# Patient Record
Sex: Male | Born: 1960 | Race: White | Hispanic: No | Marital: Married | Smoking: Never smoker
Health system: Southern US, Community
[De-identification: ages and names within clinical notes are randomized; demographics above are authoritative.]

## PROBLEM LIST (undated history)

## (undated) DIAGNOSIS — H532 Diplopia: Secondary | ICD-10-CM

## (undated) DIAGNOSIS — I10 Essential (primary) hypertension: Secondary | ICD-10-CM

## (undated) DIAGNOSIS — E785 Hyperlipidemia, unspecified: Secondary | ICD-10-CM

## (undated) DIAGNOSIS — R7989 Other specified abnormal findings of blood chemistry: Secondary | ICD-10-CM

## (undated) DIAGNOSIS — T7840XA Allergy, unspecified, initial encounter: Secondary | ICD-10-CM

## (undated) HISTORY — DX: Allergy, unspecified, initial encounter: T78.40XA

## (undated) HISTORY — PX: TONSILLECTOMY: SUR1361

## (undated) HISTORY — DX: Hyperlipidemia, unspecified: E78.5

## (undated) HISTORY — DX: Diplopia: H53.2

---

## 2013-12-20 ENCOUNTER — Emergency Department (HOSPITAL_COMMUNITY): Payer: 59

## 2013-12-20 ENCOUNTER — Encounter (HOSPITAL_COMMUNITY): Payer: Self-pay | Admitting: Emergency Medicine

## 2013-12-20 ENCOUNTER — Inpatient Hospital Stay (HOSPITAL_COMMUNITY)
Admission: EM | Admit: 2013-12-20 | Discharge: 2013-12-21 | DRG: 149 | Disposition: A | Discharge status: Home / Self Care | Payer: 59 | Attending: Internal Medicine | Admitting: Internal Medicine

## 2013-12-20 ENCOUNTER — Inpatient Hospital Stay (HOSPITAL_COMMUNITY): Payer: 59

## 2013-12-20 DIAGNOSIS — E785 Hyperlipidemia, unspecified: Secondary | ICD-10-CM | POA: Diagnosis present

## 2013-12-20 DIAGNOSIS — Z79899 Other long term (current) drug therapy: Secondary | ICD-10-CM

## 2013-12-20 DIAGNOSIS — I63539 Cerebral infarction due to unspecified occlusion or stenosis of unspecified posterior cerebral artery: Secondary | ICD-10-CM | POA: Insufficient documentation

## 2013-12-20 DIAGNOSIS — Z6826 Body mass index (BMI) 26.0-26.9, adult: Secondary | ICD-10-CM | POA: Diagnosis not present

## 2013-12-20 DIAGNOSIS — Z7982 Long term (current) use of aspirin: Secondary | ICD-10-CM | POA: Diagnosis not present

## 2013-12-20 DIAGNOSIS — Z880 Allergy status to penicillin: Secondary | ICD-10-CM

## 2013-12-20 DIAGNOSIS — I6340 Cerebral infarction due to embolism of unspecified cerebral artery: Secondary | ICD-10-CM

## 2013-12-20 DIAGNOSIS — H5500 Unspecified nystagmus: Secondary | ICD-10-CM | POA: Diagnosis present

## 2013-12-20 DIAGNOSIS — J019 Acute sinusitis, unspecified: Secondary | ICD-10-CM | POA: Diagnosis present

## 2013-12-20 DIAGNOSIS — H8309 Labyrinthitis, unspecified ear: Principal | ICD-10-CM | POA: Diagnosis present

## 2013-12-20 DIAGNOSIS — J0100 Acute maxillary sinusitis, unspecified: Secondary | ICD-10-CM

## 2013-12-20 DIAGNOSIS — R42 Dizziness and giddiness: Secondary | ICD-10-CM

## 2013-12-20 DIAGNOSIS — E669 Obesity, unspecified: Secondary | ICD-10-CM | POA: Diagnosis present

## 2013-12-20 DIAGNOSIS — I639 Cerebral infarction, unspecified: Secondary | ICD-10-CM | POA: Insufficient documentation

## 2013-12-20 DIAGNOSIS — H8110 Benign paroxysmal vertigo, unspecified ear: Secondary | ICD-10-CM | POA: Diagnosis present

## 2013-12-20 DIAGNOSIS — H8120 Vestibular neuronitis, unspecified ear: Secondary | ICD-10-CM

## 2013-12-20 DIAGNOSIS — I10 Essential (primary) hypertension: Secondary | ICD-10-CM | POA: Diagnosis present

## 2013-12-20 HISTORY — DX: Other specified abnormal findings of blood chemistry: R79.89

## 2013-12-20 HISTORY — DX: Essential (primary) hypertension: I10

## 2013-12-20 LAB — I-STAT CHEM 8, ED
BUN: 14 mg/dL (ref 6–23)
CALCIUM ION: 1.19 mmol/L (ref 1.12–1.23)
CREATININE: 0.9 mg/dL (ref 0.50–1.35)
Chloride: 101 mEq/L (ref 96–112)
Glucose, Bld: 110 mg/dL — ABNORMAL HIGH (ref 70–99)
HCT: 53 % — ABNORMAL HIGH (ref 39.0–52.0)
Hemoglobin: 18 g/dL — ABNORMAL HIGH (ref 13.0–17.0)
Potassium: 4 mEq/L (ref 3.7–5.3)
Sodium: 141 mEq/L (ref 137–147)
TCO2: 31 mmol/L (ref 0–100)

## 2013-12-20 LAB — CBC
HCT: 47.8 % (ref 39.0–52.0)
Hemoglobin: 16.2 g/dL (ref 13.0–17.0)
MCH: 31.5 pg (ref 26.0–34.0)
MCHC: 33.9 g/dL (ref 30.0–36.0)
MCV: 93 fL (ref 78.0–100.0)
PLATELETS: 288 10*3/uL (ref 150–400)
RBC: 5.14 MIL/uL (ref 4.22–5.81)
RDW: 11.8 % (ref 11.5–15.5)
WBC: 10.8 10*3/uL — ABNORMAL HIGH (ref 4.0–10.5)

## 2013-12-20 MED ORDER — STROKE: EARLY STAGES OF RECOVERY BOOK
Freq: Once | Status: AC
  Administered 2013-12-20: 18:00:00
  Filled 2013-12-20: qty 1

## 2013-12-20 MED ORDER — ASPIRIN 325 MG PO TABS
325.0000 mg | ORAL_TABLET | Freq: Every day | ORAL | Status: DC
Start: 2013-12-20 — End: 2013-12-21
  Administered 2013-12-20 – 2013-12-21 (×2): 325 mg via ORAL
  Filled 2013-12-20 (×2): qty 1

## 2013-12-20 MED ORDER — GADOBENATE DIMEGLUMINE 529 MG/ML IV SOLN
15.0000 mL | Freq: Once | INTRAVENOUS | Status: AC | PRN
  Administered 2013-12-20: 15 mL via INTRAVENOUS

## 2013-12-20 MED ORDER — ENOXAPARIN SODIUM 40 MG/0.4ML ~~LOC~~ SOLN
40.0000 mg | SUBCUTANEOUS | Status: DC
  Administered 2013-12-20: 40 mg via SUBCUTANEOUS
  Filled 2013-12-20: qty 0.4

## 2013-12-20 MED ORDER — LORAZEPAM 2 MG/ML IJ SOLN
1.0000 mg | Freq: Once | INTRAMUSCULAR | Status: AC
  Administered 2013-12-20: 1 mg via INTRAVENOUS
  Filled 2013-12-20: qty 1

## 2013-12-20 NOTE — ED Notes (Signed)
Sx started this past sat - blurred vision, dizziness with ambulation; pt. Had some vomiting that has resolved; pt.has not taken his bp meds.since wed.

## 2013-12-20 NOTE — ED Notes (Signed)
Hold until pt. Goes to [facility].

## 2013-12-20 NOTE — ED Provider Notes (Signed)
CSN: [id number]     Arrival date & time 12/20/13  0935 History   First MD Initiated Contact with Patient 12/20/13 1002     Chief Complaint  Patient presents with  . Dizziness     (Consider location/radiation/quality/duration/timing/severity/associated sxs/prior Treatment) HPI Complains of dizziness i.e. feeling of room spinning onset 4 days ago. Accompanying symptoms include mild headache, nausea and diplopia and dizziness has improved significantly since onset however diplopia continues. No treatment prior to coming here no tenderness no hearing deficit no other associated symptoms Past Medical History  Diagnosis Date  . Hypertension   . Low testosterone    History reviewed. No pertinent past surgical history. History reviewed. No pertinent family history. History  Substance Use Topics  . Smoking status: Never Smoker   . Smokeless tobacco: Not on file  . Alcohol Use: Yes     Comment: occ    Review of Systems  Constitutional: Negative.   Eyes: Positive for visual disturbance.  Respiratory: Negative.   Cardiovascular: Negative.   Gastrointestinal: Negative.   Musculoskeletal: Negative.   Skin: Negative.   Neurological: Positive for dizziness and headaches.  Psychiatric/Behavioral: Negative.   All other systems reviewed and are negative.     Allergies  Penicillins  Home Medications   Prior to Admission medications   Not on File   BP 168/98  Pulse 78  Temp(Src) 97.5 F (36.4 C) (Oral)  Resp 18  Ht 5\' 7"  (1.702 m)  Wt 170 lb (77.111 kg)  BMI 26.62 kg/m2  SpO2 100% Physical Exam  Nursing note and vitals reviewed. Constitutional: He is oriented to person, place, and time. He appears well-developed and well-nourished.  HENT:  Head: Normocephalic and atraumatic.  Bilateral tympanic membranes normal  Eyes: Conjunctivae are normal. Pupils are equal, round, and reactive to light.  Fundi benign diplopia present  Neck: Neck supple. No tracheal deviation present.  No thyromegaly present.  No bruit  Cardiovascular: Normal rate and regular rhythm.   No murmur heard. Pulmonary/Chest: Effort normal and breath sounds normal.  Abdominal: Soft. Bowel sounds are normal. He exhibits no distension. There is no tenderness.  Musculoskeletal: Normal range of motion. He exhibits no edema and no tenderness.  Neurological: He is alert and oriented to person, place, and time. He has normal reflexes. No cranial nerve deficit. Coordination normal.  Gait normal Romberg normal pronator drift normal. Finger-nose normal  Skin: Skin is warm and dry. No rash noted.  Psychiatric: He has a normal mood and affect.    ED Course  Procedures (including critical care time) Labs Review Labs Reviewed - No data to display  Imaging Review No results found.   EKG Interpretation   Date/Time:  Monday December 20 2013 09:41:48 EDT Ventricular Rate:  72 PR Interval:  150 QRS Duration: 86 QT Interval:  380 QTC Calculation: 416 R Axis:   35 Text Interpretation:  Normal sinus rhythm Normal ECG No old tracing to  compare Confirmed by Winfred Leeds  MD, Shamica Moree [phone]) on 12/20/2013 10:37:30 AM     MRI scan discussed with radiologist Dr.Olsen Results for orders placed during the hospital encounter of 12/20/13  I-STAT CHEM 8, ED      Result Value Ref Range   Sodium 141  137 - 147 mEq/L   Potassium 4.0  3.7 - 5.3 mEq/L   Chloride 101  96 - 112 mEq/L   BUN 14  6 - 23 mg/dL   Creatinine, Ser 0.90  0.50 - 1.35 mg/dL   Glucose, Bld  110 (*) 70 - 99 mg/dL   Calcium, Ion 1.19  1.12 - 1.23 mmol/L   TCO2 31  0 - 100 mmol/L   Hemoglobin 18.0 (*) 13.0 - 17.0 g/dL   HCT 53.0 (*) 39.0 - 52.0 %   Mr Jeri Cos Wo Contrast  12/20/2013   ADDENDUM REPORT: 12/20/2013 13:04  ADDENDUM: The pituitary stalk appears slightly thickened with mild nodular enhancement. Etiology indeterminate. This can be seen in setting of sarcoidosis (which could also explain white matter changes). No other evidence of  abnormal enhancement to suggest intracranial tumor/ meningeal abnormality. Langerhans cell histiocytosis causing pituitary stalk thickening typically occurs in younger age.  Results discussed with Dr. Winfred Leeds.   Electronically Signed   By: Chauncey Cruel M.D.   On: 12/20/2013 13:04   12/20/2013   ADDENDUM REPORT: 12/20/2013 12:45  ADDENDUM: Mild exophthalmos.   Electronically Signed   By: Chauncey Cruel M.D.   On: 12/20/2013 12:45   12/20/2013   CLINICAL DATA:  53 year old male was left-sided blurred vision for 3 days. Dizziness with ambulation. Hypertension. Low testosterone. Initial encounter.  EXAM: MRI HEAD WITHOUT AND WITH CONTRAST  TECHNIQUE: Multiplanar, multiecho pulse sequences of the brain and surrounding structures were obtained without and with intravenous contrast.  CONTRAST:  2mL MULTIHANCE GADOBENATE DIMEGLUMINE 529 MG/ML IV SOLN  COMPARISON:  None.  FINDINGS: No acute infarct.  No intracranial hemorrhage.  No hydrocephalus.  No intracranial mass or abnormal enhancement.  Scattered punctate and patchy nonspecific white matter type changes most notable frontal lobes. Etiology indeterminate. In a patient of this age with history of hypertension, findings are most often related to result of small vessel disease. Other causes of white matter type changes not entirely excluded although felt to be less likely include that secondary to; vasculitis, inflammatory process, demyelinating process, migraine headaches or result of prior trauma/infection.  Mild cervical spondylotic changes with spinal stenosis upper cervical spine.  Cervical medullary junction unremarkable. No obvious pituitary mass. Orbital structures appear intact.  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 This often is an incidental finding. If the patient develops diplopia with upward gaze then pineal region can be re-evaluated on follow-up imaging to exclude growth of the pineal gland compressing the superior  colliculus.  Minimal to mild mucosal thickening inferior aspect of the maxillary sinuses and minimally mucosal thickening ethmoid sinus air cells.  Major intracranial vascular structures appear patent.  Parotid lymph nodes incidentally noted.  IMPRESSION: No acute infarct.  Scattered punctate and patchy nonspecific white matter type changes most notable frontal lobes. Etiology indeterminate. In a patient of this age with history of hypertension, findings are most often related to result of small vessel disease. Other causes of white matter type changes not entirely excluded as noted above.  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  Minimal to mild mucosal thickening inferior aspect of the maxillary sinuses and minimally mucosal thickening ethmoid sinus air cells.  Electronically Signed: By: Chauncey Cruel M.D. On: 12/20/2013 12:35     MDMFinal diagnoses:  None   In light of visual changes I have consulted neurology to see pt in ED. Dr. Leonel Ramsay from neurology service evaluated patient in the ED and reports to me that despite nonfocal MRI scan he feels rather strongly the patient did indeed have a small posterior circulation stroke. Aspirin ordered. Dr. Leonel Ramsay requests admission to medical service. I spoke with Dr.Ghirge plan admit telemetry Diagnosis acute stroke  Orlie Dakin, MD 12/20/13 [phone]
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
## 2013-12-20 NOTE — ED Notes (Signed)
Pt reports having dizziness since last Thursday with "violent vomiting"; reports if he closes his eyes he feels better
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
## 2013-12-21 ENCOUNTER — Inpatient Hospital Stay (HOSPITAL_COMMUNITY): Payer: 59

## 2013-12-21 DIAGNOSIS — I517 Cardiomegaly: Secondary | ICD-10-CM

## 2013-12-21 DIAGNOSIS — J0100 Acute maxillary sinusitis, unspecified: Secondary | ICD-10-CM

## 2013-12-21 DIAGNOSIS — H5500 Unspecified nystagmus: Secondary | ICD-10-CM | POA: Diagnosis present

## 2013-12-21 DIAGNOSIS — I10 Essential (primary) hypertension: Secondary | ICD-10-CM

## 2013-12-21 DIAGNOSIS — H8120 Vestibular neuronitis, unspecified ear: Secondary | ICD-10-CM

## 2013-12-21 DIAGNOSIS — J019 Acute sinusitis, unspecified: Secondary | ICD-10-CM | POA: Diagnosis present

## 2013-12-21 DIAGNOSIS — H8110 Benign paroxysmal vertigo, unspecified ear: Secondary | ICD-10-CM

## 2013-12-21 LAB — IRON AND TIBC
Iron: 55 ug/dL (ref 42–135)
Saturation Ratios: 23 % (ref 20–55)
TIBC: 240 ug/dL (ref 215–435)
UIBC: 185 ug/dL (ref 125–400)

## 2013-12-21 LAB — LIPID PANEL
CHOL/HDL RATIO: 5.3 ratio
Cholesterol: 211 mg/dL — ABNORMAL HIGH (ref 0–200)
HDL: 40 mg/dL (ref >39)
LDL CALC: 138 mg/dL — AB (ref 0–99)
Triglycerides: 165 mg/dL — ABNORMAL HIGH (ref <150)
VLDL: 33 mg/dL (ref 0–40)

## 2013-12-21 LAB — HEMOGLOBIN A1C
Hgb A1c MFr Bld: 5.5 % (ref <5.7)
Mean Plasma Glucose: 111 mg/dL (ref <117)

## 2013-12-21 LAB — T4, FREE: FREE T4: 1.13 ng/dL (ref 0.80–1.80)

## 2013-12-21 LAB — T3, FREE: T3, Free: 3.6 pg/mL (ref 2.3–4.2)

## 2013-12-21 LAB — ANGIOTENSIN CONVERTING ENZYME: Angiotensin-Converting Enzyme: 15 U/L (ref 8–52)

## 2013-12-21 LAB — FERRITIN: FERRITIN: 186 ng/mL (ref 22–322)

## 2013-12-21 LAB — TSH: TSH: 1.45 u[IU]/mL (ref 0.350–4.500)

## 2013-12-21 MED ORDER — IBUPROFEN 600 MG PO TABS: 600.0000 mg | ORAL_TABLET | Freq: Four times a day (QID) | ORAL | Status: DC | PRN

## 2013-12-21 MED ORDER — ATORVASTATIN CALCIUM 10 MG PO TABS: 10.0000 mg | ORAL_TABLET | Freq: Every day | ORAL | Status: DC

## 2013-12-21 MED ORDER — ASPIRIN 325 MG PO TABS: 325.0000 mg | ORAL_TABLET | Freq: Every day | ORAL | Status: DC

## 2013-12-21 MED ORDER — CEFUROXIME AXETIL 500 MG PO TABS: 500.0000 mg | ORAL_TABLET | Freq: Two times a day (BID) | ORAL | Status: DC

## 2013-12-21 MED ORDER — MECLIZINE HCL 25 MG PO TABS: 25.0000 mg | ORAL_TABLET | Freq: Three times a day (TID) | ORAL | Status: DC | PRN

## 2013-12-21 MED ORDER — IOHEXOL 350 MG/ML SOLN
50.0000 mL | Freq: Once | INTRAVENOUS | Status: AC | PRN
  Administered 2013-12-21: 50 mL via INTRAVENOUS

## 2013-12-21 MED ORDER — IBUPROFEN 600 MG PO TABS
600.0000 mg | ORAL_TABLET | Freq: Four times a day (QID) | ORAL | Status: DC | PRN
  Administered 2013-12-21: 600 mg via ORAL
  Filled 2013-12-21 (×2): qty 1

## 2013-12-21 MED ORDER — MECLIZINE HCL 12.5 MG PO TABS
12.5000 mg | ORAL_TABLET | Freq: Three times a day (TID) | ORAL | Status: DC
  Administered 2013-12-21: 12.5 mg via ORAL
  Filled 2013-12-21: qty 1

## 2013-12-21 MED ORDER — PROMETHAZINE HCL 25 MG PO TABS: 25.0000 mg | ORAL_TABLET | Freq: Four times a day (QID) | ORAL | Status: DC | PRN

## 2013-12-21 NOTE — Progress Notes (Signed)
SLP Cancellation Note  Patient Details Name: Anthony Shaw MRN: [id number] DOB: 25-Dec-1960   Cancelled treatment:       Reason Eval/Treat Not Completed: SLP screened, no needs identified, will sign off   Delvina Mizzell, Katherene Ponto 12/21/2013, 8:25 AM

## 2013-12-21 NOTE — Progress Notes (Signed)
STROKE TEAM PROGRESS NOTE   HISTORY Anthony Shaw is a 53 y.o. male with a history of hypertension and low testosterone who presents with sudden onset vertigo 3 nights ago. He says he had a transient episode of vertigo earlier in the evening, but it passed fairly quickly. He did not think much of it and subsequently was awoken and overnight by violent vertigo with nausea and vomiting. He has also complained of diplopia worse on leftward gaze that has been present since that time. The nausea and vomiting have somewhat died down, but she was still having difficulty with walking and therefore presented to the emergency room today.  LKW: Evening 10/22  Patient was not administered TPA secondary to delay in arrival. He was admitted for further evaluation and treatment.   SUBJECTIVE (INTERVAL HISTORY) His wife is at the bedside.  Overall he feels his condition is gradually improving. He stated that on Thursday night during the football game, he started feeling that "his eye lagging behind his head movement". Friday he woke up at midnight complain of vertigo, N/V, worsening with eye open. He slept almost whole day in bed, not able to stand up or walk. Saturday, vertigo got better but he started to have "double vision". Sunday vertigo gone but still feeling woozy and "double vision" continued. He came to ER for evaluation. MRI negative for stroke. Today he felt much better saying that double vision almost gone but seems looking left still has some "double vision".  OBJECTIVE Temp:  [97.3 F (36.3 C)-98.9 F (37.2 C)] 98.4 F (36.9 C) (10/27 0958) Pulse Rate:  [62-90] 66 (10/27 0958) Cardiac Rhythm:  [-] Normal sinus rhythm (10/26 2038) Resp:  [15-23] 16 (10/27 0958) BP: (107-165)/(73-95) 134/83 mmHg (10/27 0958) SpO2:  [95 %-100 %] 95 % (10/27 0958)  No results found for this basename: GLUCAP,  in the last 168 hours  Recent Labs Lab 12/20/13 1051  NA 141  K 4.0  CL 101  GLUCOSE 110*  BUN 14   CREATININE 0.90   No results found for this basename: AST, ALT, ALKPHOS, BILITOT, PROT, ALBUMIN,  in the last 168 hours  Recent Labs Lab 12/20/13 1051 12/20/13 1408  WBC  --  10.8*  HGB 18.0* 16.2  HCT 53.0* 47.8  MCV  --  93.0  PLT  --  288   No results found for this basename: CKTOTAL, CKMB, CKMBINDEX, TROPONINI,  in the last 168 hours No results found for this basename: LABPROT, INR,  in the last 72 hours No results found for this basename: COLORURINE, [place], LABSPEC, [place], GLUCOSEU, HGBUR, BILIRUBINUR, KETONESUR, PROTEINUR, UROBILINOGEN, NITRITE, LEUKOCYTESUR,  in the last 72 hours     Component Value Date/Time   CHOL 211* 12/21/2013 0539   TRIG 165* 12/21/2013 0539   HDL 40 12/21/2013 0539   CHOLHDL 5.3 12/21/2013 0539   VLDL 33 12/21/2013 0539   LDLCALC 138* 12/21/2013 0539   No results found for this basename: HGBA1C   No results found for this basename: labopia,  cocainscrnur,  labbenz,  amphetmu,  thcu,  labbarb    No results found for this basename: ETH,  in the last 168 hours   Ct Angio Head & Neck W/cm &/or Wo/cm 12/21/2013   CTA examination of the head and neck shows no vascular stenosis or occlusion. No acute intracranial findings.  Mild nodular enhancement of the pituitary stalk is redemonstrated, nonspecific.     Mr Anthony Shaw Wo Contrast 12/20/2013   Mild exophthalmos.  No acute infarct.  Scattered punctate and patchy nonspecific white matter type changes most notable frontal lobes. Etiology indeterminate. In a patient of this age with history of hypertension, findings are most often related to result of small vessel disease. Other causes of white matter type changes not entirely excluded as noted above.  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  Minimal to mild mucosal thickening inferior aspect of the maxillary sinuses and minimally mucosal thickening ethmoid sinus air cells. The pituitary stalk  appears slightly thickened with mild nodular enhancement. Etiology indeterminate. This can be seen in setting of sarcoidosis (which could also explain white matter changes). No other evidence of abnormal enhancement to suggest intracranial tumor/ meningeal abnormality. Langerhans cell histiocytosis causing pituitary stalk thickening typically occurs in younger age.    Mr Brain Ltd W/o Cm 12/20/2013    Limited thin section coronal imaging through the posterior fossa structures was performed per request of Dr. Leonel Ramsay. On this thin section diffusion sequence, no acute infarct is noted.    2D Echocardiogram  LVEF 55-60%, mild LVH, normal diastolic function, MAC with trivial MR.   PHYSICAL EXAM  Temp:  [97.3 F (36.3 C)-98.4 F (36.9 C)] 98.1 F (36.7 C) (10/27 1333) Pulse Rate:  [62-85] 85 (10/27 1333) Resp:  [16-18] 18 (10/27 1333) BP: (107-134)/(73-83) 133/76 mmHg (10/27 1333) SpO2:  [95 %-100 %] 97 % (10/27 1333)  General - Well nourished, well developed, in no apparent distress.  Ophthalmologic - Sharp disc margins OU.  Cardiovascular - Regular rate and rhythm with no murmur.  Mental Status -  Level of arousal and orientation to time, place, and person were intact. Language including expression, naming, repetition, comprehension was assessed and found intact. Fund of Knowledge was assessed and was intact.  Cranial Nerves II - XII - II - Visual field intact OU. III, IV, VI - Extraocular movements intact, nystagmus with fast phase to the right seen on upward gaze and left side gaze. V - Facial sensation intact bilaterally. VII - Facial movement intact bilaterally. VIII - Hearing & vestibular intact bilaterally. X - Palate elevates symmetrically. XI - Chin turning & shoulder shrug intact bilaterally. XII - Tongue protrusion intact.  Motor Strength - The patient's strength was normal in all extremities and pronator drift was absent.  Bulk was normal and fasciculations were  absent.   Motor Tone - Muscle tone was assessed at the neck and appendages and was normal.  Reflexes - The patient's reflexes were normal in all extremities and he had no pathological reflexes.  Sensory - Light touch, temperature/pinprick, vibration and proprioception, and Romberg testing were assessed and were normal.   Coordination - The patient had normal movements in the hands and feet with no ataxia or dysmetria.  Tremor was absent.  Gait and Station - The patient's transfers, posture, gait, station, and turns were observed as normal.  Head thrust positive on the right but normal on the left.   ASSESSMENT/PLAN Mr. Anthony Shaw is a 53 y.o. male with history of hypertension and low testosterone presenting with sudden onset vertigo with "double vision" 3 nights prior to admission. He did not receive IV t-PA  due to delay in arrival. His "double vision" most likely caused by nystagmus, not true diplopia. His symptoms getting improved over time. Denies recent infection, no hearing loss or ear ringing. MRI negative even with thin cut. With positive unilateral nystagmus and positive unilateral head thrust test, consistent with vestibular neuritis,  instead of central vertigo.   Most consistent with vestibular neuritis, less likely acute stroke  MRI  No acute infarct  CTA head and neck no acute stroke. Mild nodular enhancement of the pituitary stalk. No significant stenosis.  2D Echo  No significant stenosis   HgbA1c 5.5, on the goal  Lovenox 40 mg sq daily for VTE prophylaxis  Cardiac thin liquids  Activity as tolerated  aspirin 325 mg orally every day prior to admission, now on aspirin 325 mg orally every day  Ongoing aggressive risk factor management  Resultant nystamus to right and up, positive R head thrust test, saccadic movements improving  Therapy recommendations:  No therapy needs  Anticipate resolution in 7-10 days  Can use meclizine for symptomatic  relief  Ibuprofen/tylenol for HA relief  Disposition:  Anticipate d/c home with wife  Ok for discharge from neuro standpoint  No neuro follow up indicated  Hypertension  Home meds:  mavik  Stable  BP goal normotensive  Hyperlipidemia  Home meds:  No statin  LDL 138 not on the goal < 100  Now on statin, lipitor 10mg   Continue statin at d/c  Other Stroke Risk Factors   Mild Obesity, Body mass index is 26.62 kg/(m^2).   Other Active Problems  Low testosterone  Hospital day # 1  SHARON BIBY, MSN, RN, ANVP-BC, ANP-BC, [facility] Pager: [phone] 12/21/2013 10:28 AM   I, the attending vascular neurologist, have personally obtained a history, examined the patient, evaluated laboratory data, individually viewed imaging studies, and formulated the assessment and plan of care.  I have made any additions or clarifications directly to the above note and agree with the findings and plan as currently documented.    Rosalin Hawking, MD PhD Stroke Neurology 12/21/2013 11:02 PM    To contact Stroke Continuity provider, please refer to [email or web address] After hours, contact General Neurology

## 2013-12-21 NOTE — Progress Notes (Signed)
  Echocardiogram 2D Echocardiogram has been performed.  Diamond Nickel 12/21/2013, 9:58 AM

## 2013-12-21 NOTE — Discharge Summary (Signed)
Physician Discharge Summary  Patient ID: Anthony Shaw MRN: [id number] DOB/AGE: [birth year] 53 y.o.  Admit date: 12/20/2013 Discharge date: 12/21/2013  Primary Care Physician:  No primary provider on file.  Discharge Diagnoses:    . Essential hypertension . Benign paroxysmal positional vertigo . Nystagmus . Acute sinusitis  Consults:  Neurology   Allergies:   Allergies  Allergen Reactions  . Penicillins Rash     Discharge Medications:   Medication List         ANDROGEL 20.25 MG/1.25GM (1.62%) Gel  Generic drug:  Testosterone  Place 4 Act onto the skin daily.     aspirin 325 MG tablet  Take 1 tablet (325 mg total) by mouth daily.     atorvastatin 10 MG tablet  Commonly known as:  LIPITOR  Take 1 tablet (10 mg total) by mouth at bedtime.     cefUROXime 500 MG tablet  Commonly known as:  CEFTIN  Take 1 tablet (500 mg total) by mouth 2 (two) times daily with a meal. X 10days     ibuprofen 600 MG tablet  Commonly known as:  ADVIL,MOTRIN  Take 1 tablet (600 mg total) by mouth every 6 (six) hours as needed for headache or mild pain.     meclizine 25 MG tablet  Commonly known as:  ANTIVERT  Take 1 tablet (25 mg total) by mouth 3 (three) times daily as needed for dizziness (also available OTC).     promethazine 25 MG tablet  Commonly known as:  PHENERGAN  Take 1 tablet (25 mg total) by mouth every 6 (six) hours as needed for nausea or vomiting.     trandolapril 4 MG tablet  Commonly known as:  MAVIK  Take 4 mg by mouth daily.         Brief H and P: For complete details please refer to admission H and P, but in brief Anthony Shaw is a 53 y.o. male has a past medical history significant for low testosterone, HTN, presents to the ED with a chief complaint of vertigo that started about 3 days ago. This was associated with an episode of emesis. Over the past 3 days he has had persistent symptoms and poor balance and when his symptoms have not resolved decided to  come to the ED. He endorsed 2 episodes of very transient few seconds long episodes of dizziness about a week ago. He also endorses having double vision for the past 3 days, slowly improving, now with double vision only when he looks towards left. He denies any prior similar episodes. He has had no fever, chills, no chest pain or breathing difficulties. In the ED neurology was consulted and per them patient with probability of small lateral medullary CVA even with a negative MRI.   Hospital Course:  Vertigo with acute sinusitis: Likely vestibulitis caused by recent viral infection or acute sinusitis Patient was admitted with concern for possible small medullary CVA. MRI showed no acute infarct. CT angiogram of the head and neck showed no acute stroke. Mild medullary enhancement of pituitary stalk otherwise no significant stenosis. 2-D echo showed EF of 55-60%, mild LVH LDL 138, cholesterol 211, patient was placed on statins TSH 1.4 Hemoglobin A1c pending Patient was placed on meclizine for symptomatic relief and ibuprofen for headache Patient is back to his baseline status and DC home  Acute sinusitis: MRI of the brain showed mild maxillary and ethmoid sinusitis, placed on Ceftin  Hyperlipidemia: Started on statin  Day of Discharge BP 133/76  Pulse 85  Temp(Src) 98.1 F (36.7 C) (Oral)  Resp 18  Ht 5\' 7"  (1.702 m)  Wt 77.111 kg (170 lb)  BMI 26.62 kg/m2  SpO2 97%  Physical Exam: General: Alert and awake oriented x3 not in any acute distress. HEENT: anicteric sclera, pupils reactive to light and accommodation, + nystagmus on left gaze, mild left ptosis CVS: S1-S2 clear no murmur rubs or gallops Chest: clear to auscultation bilaterally, no wheezing rales or rhonchi Abdomen: soft nontender, nondistended, normal bowel sounds Extremities: no cyanosis, clubbing or edema noted bilaterally Neuro: Strength 5/ 5 in all 4 extremities   The results of significant diagnostics from this  hospitalization (including imaging, microbiology, ancillary and laboratory) are listed below for reference.    LAB RESULTS: Basic Metabolic Panel:  Recent Labs Lab 12/20/13 1051  NA 141  K 4.0  CL 101  GLUCOSE 110*  BUN 14  CREATININE 0.90   Liver Function Tests: No results found for this basename: AST, ALT, ALKPHOS, BILITOT, PROT, ALBUMIN,  in the last 168 hours No results found for this basename: LIPASE, AMYLASE,  in the last 168 hours No results found for this basename: AMMONIA,  in the last 168 hours CBC:  Recent Labs Lab 12/20/13 1051 12/20/13 1408  WBC  --  10.8*  HGB 18.0* 16.2  HCT 53.0* 47.8  MCV  --  93.0  PLT  --  288   Cardiac Enzymes: No results found for this basename: CKTOTAL, CKMB, CKMBINDEX, TROPONINI,  in the last 168 hours BNP: No components found with this basename: POCBNP,  CBG: No results found for this basename: GLUCAP,  in the last 168 hours  Significant Diagnostic Studies:  Ct Angio Head W/cm &/or Wo Cm  12/21/2013   CLINICAL DATA:  Sudden onset of vertigo 3 days ago. Neurologic exam remarkable for LEFT ptosis and diplopia.  EXAM: CT ANGIOGRAPHY HEAD AND NECK  TECHNIQUE: Multidetector CT imaging of the head and neck was performed using the standard protocol during bolus administration of intravenous contrast. Multiplanar CT image reconstructions and MIPs were obtained to evaluate the vascular anatomy. Carotid stenosis measurements (when applicable) are obtained utilizing NASCET criteria, using the distal internal carotid diameter as the denominator.  CONTRAST:  87mL OMNIPAQUE IOHEXOL 350 MG/ML SOLN  COMPARISON:  MRI brain 12/20/2013.  FINDINGS: CTA HEAD FINDINGS  No evidence for acute infarction, hemorrhage, mass lesion, hydrocephalus, or extra-axial fluid. Post infusion, no abnormal enhancement of the brain or meninges. Calvarium intact.  Both internal carotid arteries are widely patent. The basilar artery is widely patent with vertebrals both  contributing, RIGHT dominant. The anterior, middle, and posterior cerebral arteries appear widely patent. No intracranial aneurysm. No cerebellar branch occlusion.  Compared with prior MR, Mild nodular enhancement of the pituitary stalk, most notable at its hypothalamic insertion, is redemonstrated. This appearance is nonspecific. Please see MR report for discussion.  Review of the MIP images confirms the above findings.  CTA NECK FINDINGS  Conventional branching of the great vessels from the arch. No proximal stenosis.  RIGHT vertebral dominant with LEFT vertebral patent. No ostial disease.  RIGHT and LEFT carotid bifurcations free of ulceration, plaque formation, or stenosis. No dissection or fibromuscular change.  There is no dissection or narrowing of the vertebral arteries in the neck, RIGHT or LEFT. Mild uncinate spurring at C5-6 and C6-7 on the RIGHT does not cause luminal stenosis or irregularity of the vertebral artery.  Severe disc space narrowing at C5-6 and C6-7. 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 Lung apices clear. No neck masses. No osseous findings.  Review of the MIP images confirms the above findings.  IMPRESSION: CTA examination of the head and neck shows no vascular stenosis or occlusion. No acute intracranial findings.  Mild nodular enhancement of the pituitary stalk is redemonstrated, nonspecific.   Electronically Signed   By: Rolla Flatten M.D.   On: 12/21/2013 09:55   Ct Angio Neck W/cm &/or Wo/cm  12/21/2013   CLINICAL DATA:  Sudden onset of vertigo 3 days ago. Neurologic exam remarkable for LEFT ptosis and diplopia.  EXAM: CT ANGIOGRAPHY HEAD AND NECK  TECHNIQUE: Multidetector CT imaging of the head and neck was performed using the standard protocol during bolus administration of intravenous contrast. Multiplanar CT image reconstructions and MIPs were obtained to evaluate the vascular anatomy. Carotid stenosis  measurements (when applicable) are obtained utilizing NASCET criteria, using the distal internal carotid diameter as the denominator.  CONTRAST:  66mL OMNIPAQUE IOHEXOL 350 MG/ML SOLN  COMPARISON:  MRI brain 12/20/2013.  FINDINGS: CTA HEAD FINDINGS  No evidence for acute infarction, hemorrhage, mass lesion, hydrocephalus, or extra-axial fluid. Post infusion, no abnormal enhancement of the brain or meninges. Calvarium intact.  Both internal carotid arteries are widely patent. The basilar artery is widely patent with vertebrals both contributing, RIGHT dominant. The anterior, middle, and posterior cerebral arteries appear widely patent. No intracranial aneurysm. No cerebellar branch occlusion.  Compared with prior MR, Mild nodular enhancement of the pituitary stalk, most notable at its hypothalamic insertion, is redemonstrated. This appearance is nonspecific. Please see MR report for discussion.  Review of the MIP images confirms the above findings.  CTA NECK FINDINGS  Conventional branching of the great vessels from the arch. No proximal stenosis.  RIGHT vertebral dominant with LEFT vertebral patent. No ostial disease.  RIGHT and LEFT carotid bifurcations free of ulceration, plaque formation, or stenosis. No dissection or fibromuscular change.  There is no dissection or narrowing of the vertebral arteries in the neck, RIGHT or LEFT. Mild uncinate spurring at C5-6 and C6-7 on the RIGHT does not cause luminal stenosis or irregularity of the vertebral artery.  Severe disc space narrowing at C5-6 and C6-7. 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 Lung apices clear. No neck masses. No osseous findings.  Review of the MIP images confirms the above findings.  IMPRESSION: CTA examination of the head and neck shows no vascular stenosis or occlusion. No acute intracranial findings.  Mild nodular enhancement of the pituitary stalk is redemonstrated,  nonspecific.   Electronically Signed   By: Rolla Flatten M.D.   On: 12/21/2013 09:55   Mr Jeri Cos XB Contrast  12/20/2013   ADDENDUM REPORT: 12/20/2013 13:04  ADDENDUM: The pituitary stalk appears slightly thickened with mild nodular enhancement. Etiology indeterminate. This can be seen in setting of sarcoidosis (which could also explain white matter changes). No other evidence of abnormal enhancement to suggest intracranial tumor/ meningeal abnormality. Langerhans cell histiocytosis causing pituitary stalk thickening typically occurs in younger age.  Results discussed with Dr. Winfred Leeds.   Electronically Signed   By: Chauncey Cruel M.D.   On: 12/20/2013 13:04   12/20/2013   ADDENDUM REPORT: 12/20/2013 12:45  ADDENDUM: Mild exophthalmos.   Electronically Signed   By: Chauncey Cruel M.D.   On: 12/20/2013 12:45   12/20/2013   CLINICAL DATA:  53 year old male was left-sided blurred vision for  3 days. Dizziness with ambulation. Hypertension. Low testosterone. Initial encounter.  EXAM: MRI HEAD WITHOUT AND WITH CONTRAST  TECHNIQUE: Multiplanar, multiecho pulse sequences of the brain and surrounding structures were obtained without and with intravenous contrast.  CONTRAST:  42mL MULTIHANCE GADOBENATE DIMEGLUMINE 529 MG/ML IV SOLN  COMPARISON:  None.  FINDINGS: No acute infarct.  No intracranial hemorrhage.  No hydrocephalus.  No intracranial mass or abnormal enhancement.  Scattered punctate and patchy nonspecific white matter type changes most notable frontal lobes. Etiology indeterminate. In a patient of this age with history of hypertension, findings are most often related to result of small vessel disease. Other causes of white matter type changes not entirely excluded although felt to be less likely include that secondary to; vasculitis, inflammatory process, demyelinating process, migraine headaches or result of prior trauma/infection.  Mild cervical spondylotic changes with spinal stenosis upper cervical spine.   Cervical medullary junction unremarkable. No obvious pituitary mass. Orbital structures appear intact.  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 This often is an incidental finding. If the patient develops diplopia with upward gaze then pineal region can be re-evaluated on follow-up imaging to exclude growth of the pineal gland compressing the superior colliculus.  Minimal to mild mucosal thickening inferior aspect of the maxillary sinuses and minimally mucosal thickening ethmoid sinus air cells.  Major intracranial vascular structures appear patent.  Parotid lymph nodes incidentally noted.  IMPRESSION: No acute infarct.  Scattered punctate and patchy nonspecific white matter type changes most notable frontal lobes. Etiology indeterminate. In a patient of this age with history of hypertension, findings are most often related to result of small vessel disease. Other causes of white matter type changes not entirely excluded as noted above.  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  Minimal to mild mucosal thickening inferior aspect of the maxillary sinuses and minimally mucosal thickening ethmoid sinus air cells.  Electronically Signed: By: Chauncey Cruel M.D. On: 12/20/2013 12:35   Mr Brain Ltd W/o Cm  12/20/2013   CLINICAL DATA:  53 year old male with vertigo which started 3 days ago. Subsequent encounter.  EXAM: MRI HEAD WITHOUT CONTRAST  TECHNIQUE: Multiplanar, multiecho pulse sequences of the brain and surrounding structures were obtained without intravenous contrast.  COMPARISON:  12/20/2013 MR.  FINDINGS: Limited thin section coronal imaging through the posterior fossa structures was performed per request of Dr. Leonel Ramsay. On this thin section diffusion sequence, no acute infarct is noted.  IMPRESSION: Limited thin section coronal imaging through the posterior fossa structures was performed per request of Dr. Leonel Ramsay. On  this thin section diffusion sequence, no acute infarct is noted.   Electronically Signed   By: Chauncey Cruel M.D.   On: 12/20/2013 19:50    2D ECHO: Study Conclusions  - Left ventricle: The cavity size was normal. Wall thickness was increased in a pattern of mild LVH. Systolic function was normal. The estimated ejection fraction was in the range of 55% to 60%. Wall motion was normal; there were no regional wall motion abnormalities. Left ventricular diastolic function parameters were normal. - Mitral valve: Calcified annulus. There was trivial regurgitation. - Left atrium: The atrium was normal in size.  Impressions:  - LVEF 55-60%, mild LVH, normal diastolic function, MAC with trivial MR.    Disposition and Follow-up: Discharge Instructions   Diet - low sodium heart healthy    Complete by:  As directed      Increase activity slowly  Complete by:  As directed             DISPOSITION: Home  DIET: Heart healthy diet    DISCHARGE FOLLOW-UP Follow-up Information   Schedule an appointment as soon as possible for a visit in 10 days to follow up. (for hospital follow-up)    Contact information:   Please follow-up with your primary care physician      Time spent on Discharge: 37 mins  Signed:   Dekota Shenk M.D. [facility] 12/21/2013, 2:10 PM Pager: [phone]

## 2013-12-21 NOTE — Progress Notes (Signed)
Referral made with [facility] as requested for Outpatient PT Vestibular therapy; Aneta Mins [phone]

## 2013-12-21 NOTE — Progress Notes (Signed)
CARE MANAGEMENT NOTE 12/21/2013  Patient:  Anthony Shaw, Anthony Shaw   Account Number:  [id number]  Date Initiated:  12/21/2013  Documentation initiated by:  Olga Coaster  Subjective/Objective Assessment:   ADMITTED FOR STROKE WORKUP     Action/Plan:   CM FOLLOWING FOR DCP   Anticipated DC Date:  12/24/2013   Anticipated DC Plan:  [place]  CM consult         Status of service:  In process, will continue to follow Medicare Important Message given?   (If response is "NO", the following Medicare IM given date fields will be blank)  Per UR Regulation:  Reviewed for med. necessity/level of care/duration of stay  Comments:  10/27/2015Mindi Slicker RN,BSN,MHA [phone]

## 2013-12-21 NOTE — Evaluation (Signed)
Physical Therapy Evaluation Patient Details Name: Anthony Shaw MRN: [id number] DOB: 09-06-1960 Today's Date: 12/21/2013   History of Present Illness  Patient is a 53 y/o male with PMH of low testosterone and HTN presents to the ED with a chief complaint of vertigo, associated with emesis that started about 3 days ago. Over the past 3 days pt has had persistent symptoms and poor balance. CVA ruled out. Per MD notes, pt admitted with vestibulitis as result of recent viral infection.   Clinical Impression  Patient presents with functional limitations due to deficits listed in PT problem list (see below). Pt with mild balance deficits especially with head movements or in distracted environment however no overt LOB. Pt with symptoms concerning for a vestibular hypofunction. Instructed pt in gaze stabilization exercises for home. Education provided on compensatory techniques at home to improve safety/balance. Pt would benefit from acute PT and follow up vestibular OPPT to improve balance and overall safe mobility.    Follow Up Recommendations Outpatient PT (Vesitibular rehab)    Equipment Recommendations  None recommended by PT    Recommendations for Other Services       Precautions / Restrictions Precautions Precautions: Fall Restrictions Weight Bearing Restrictions: No      Mobility  Bed Mobility Overal bed mobility: Independent                Transfers Overall transfer level: Independent Equipment used: None Transfers: Sit to/from Stand Sit to Stand: Supervision         General transfer comment: Supervision for safety due to feeling unsteady.  Ambulation/Gait Ambulation/Gait assistance: Supervision Ambulation Distance (Feet): 200 Feet Assistive device: None Gait Pattern/deviations: Step-through pattern;Decreased stride length;Drifts right/left   Gait velocity interpretation: Below normal speed for age/gender General Gait Details: Gait is steady except with head  turns or distractions in the environment. Drifting noted at times with head turns, however no overt LOB.  Stairs Stairs: Yes Stairs assistance: Supervision Stair Management: Two rails Number of Stairs: 3    Wheelchair Mobility    Modified Rankin (Stroke Patients Only)       Balance Overall balance assessment: Needs assistance Sitting-balance support: Feet supported;No upper extremity supported Sitting balance-Leahy Scale: Good     Standing balance support: During functional activity Standing balance-Leahy Scale: Fair                 High Level Balance Comments: Pt with difficulty when performing head turns during gait training with staggering noted at times.              Pertinent Vitals/Pain Pain Assessment: No/denies pain    Home Living Family/patient expects to be discharged to:: Private residence Living Arrangements: Spouse/significant other   Type of Home: House Home Access: Stairs to enter Entrance Stairs-Rails: None Technical brewer of Steps: 3 Home Layout: One level Home Equipment: None      Prior Function Level of Independence: Independent               Hand Dominance        Extremity/Trunk Assessment   Upper Extremity Assessment: Overall WFL for tasks assessed           Lower Extremity Assessment: Overall WFL for tasks assessed      Cervical / Trunk Assessment: Normal  Communication   Communication: No difficulties  Cognition Arousal/Alertness: Awake/alert Behavior During Therapy: WFL for tasks assessed/performed Overall Cognitive Status: Within Functional Limits for tasks assessed  General Comments General comments (skin integrity, edema, etc.): Pt with L beating nystagmus and (+) R head thrust.    Exercises Other Exercises Other Exercises: Instructed pt in gaze stabilization exercises.      Assessment/Plan    PT Assessment Patient needs continued PT services  PT Diagnosis  Difficulty walking   PT Problem List Decreased mobility;Decreased balance  PT Treatment Interventions Balance training;Neuromuscular re-education;Patient/family education   PT Goals (Current goals can be found in the Care Plan section) Acute Rehab PT Goals Patient Stated Goal: to get better PT Goal Formulation: With patient Time For Goal Achievement: 01/04/14 Potential to Achieve Goals: Good    Frequency Min 3X/week   Barriers to discharge        Co-evaluation               End of Session Equipment Utilized During Treatment: Gait belt Activity Tolerance: Patient tolerated treatment well Patient left: in bed;with call bell/phone within reach;with family/visitor present Nurse Communication: Mobility status         Time: 0370-4888 PT Time Calculation (min): 31 min   Charges:   PT Evaluation $Initial PT Evaluation Tier I: 1 Procedure PT Treatments $Therapeutic Activity: 8-22 mins   PT G CodesCandy Sledge A 12/21/2013, 2:27 PM Candy Sledge, Graham, DPT [phone]

## 2013-12-21 NOTE — Progress Notes (Signed)
Pt discharged at this time with wife. Alert, verbal with no complaints voiced. Stable with no noted distress or discomfort. Discharge instructions provided to pt and wife with verbal understanding. Wife took all personal belongings. Prescriptions along with education given to pt to fill at pharmacy. IV discontinued with dry dressing. Pt stated he will follow up with PCP per discharge orders. Case manager notified who put in order for Outpatient rehab and Vestibular per recommendation from therapy.

## 2013-12-21 NOTE — Progress Notes (Signed)
Pt returned with reaction from CT. Observed with no noted respiratory distress O2 sat 100% HOB elevated during assessment. Pt noted with redness to his face and puffy to cheeks and lips. Pt stated that he  never had a reaction but  felt fine. Md notified. No verbal orders received. Will continue to monitor

## 2014-01-31 ENCOUNTER — Ambulatory Visit (INDEPENDENT_AMBULATORY_CARE_PROVIDER_SITE_OTHER): Payer: 59 | Admitting: Neurology

## 2014-01-31 ENCOUNTER — Encounter: Payer: Self-pay | Admitting: Neurology

## 2014-01-31 VITALS — BP 137/84 | HR 76 | Ht 68.0 in | Wt 182.8 lb

## 2014-01-31 DIAGNOSIS — H532 Diplopia: Secondary | ICD-10-CM

## 2014-01-31 DIAGNOSIS — H8110 Benign paroxysmal vertigo, unspecified ear: Secondary | ICD-10-CM

## 2014-01-31 HISTORY — DX: Diplopia: H53.2

## 2014-01-31 MED ORDER — PREDNISONE 5 MG PO TABS: ORAL_TABLET | ORAL | Status: DC

## 2014-01-31 NOTE — Patient Instructions (Signed)
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
## 2014-02-10 LAB — TSH: TSH: 1.93 u[IU]/mL (ref 0.450–4.500)

## 2014-02-10 LAB — ANGIOTENSIN CONVERTING ENZYME: Angio Convert Enzyme: <14 U/L — ABNORMAL LOW (ref 14–82)

## 2014-02-10 LAB — LYME, TOTAL AB TEST/REFLEX

## 2014-02-10 LAB — VITAMIN B12: Vitamin B-12: 753 pg/mL (ref 211–946)

## 2014-02-10 LAB — RPR: RPR: NONREACTIVE

## 2014-02-10 LAB — ACETYLCHOLINE RECEPTOR, BINDING: ACHR BINDING AB, SERUM: 0.03 nmol/L (ref 0.00–0.24)

## 2014-02-10 LAB — SEDIMENTATION RATE: Sed Rate: 2 mm/hr (ref 0–30)

## 2014-02-10 LAB — RHEUMATOID FACTOR: RHEUMATOID FACTOR: 8.5 [IU]/mL (ref 0.0–13.9)

## 2014-02-10 LAB — ANA W/REFLEX: ANA: NEGATIVE

## 2014-08-02 ENCOUNTER — Telehealth: Payer: Self-pay | Admitting: Neurology

## 2014-08-02 NOTE — Telephone Encounter (Signed)
I called patient. The patient is to contact our office if he is amenable to having a repeat MRI the brain to compare to the prior study done. I left a message.

## 2015-10-15 IMAGING — CT CT ANGIO HEAD
1 of 12 series · 1 of 33 positions shown · IV contrast (omnipaque)
Comparison: MRI brain 12/20/2013.

CLINICAL DATA: Sudden onset of vertigo 3 days ago. Neurologic exam
remarkable for LEFT ptosis and diplopia.

EXAM:
CT ANGIOGRAPHY HEAD AND NECK
TECHNIQUE: Multidetector CT imaging of the head and neck was performed using
the standard protocol during bolus administration of intravenous
contrast. Multiplanar CT image reconstructions and MIPs were
obtained to evaluate the vascular anatomy. Carotid stenosis
measurements (when applicable) are obtained utilizing NASCET
criteria, using the distal internal carotid diameter as the
denominator.
CONTRAST:  50mL OMNIPAQUE IOHEXOL 350 MG/ML SOLN

[Series 300: locator · axial · 0.49mm/px · 1 of 1 slices shown]
[im 1/1  soft-tissue]
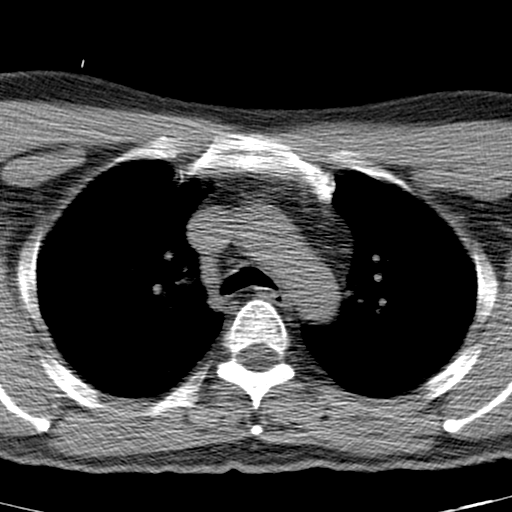

[1 of 33 positions shown; findings below may reference images not displayed]

FINDINGS: CTA HEAD FINDINGS

No evidence for acute infarction, hemorrhage, mass lesion,
hydrocephalus, or extra-axial fluid. Post infusion, no abnormal
enhancement of the brain or meninges. Calvarium intact.

Both internal carotid arteries are widely patent. The basilar artery
is widely patent with vertebrals both contributing, RIGHT dominant.
The anterior, middle, and posterior cerebral arteries appear widely
patent. No intracranial aneurysm. No cerebellar branch occlusion.

Compared with prior MR, Mild nodular enhancement of the pituitary
stalk, most notable at its hypothalamic insertion, is
redemonstrated. This appearance is nonspecific. Please see MR report
for discussion.

Review of the MIP images confirms the above findings.

CTA NECK FINDINGS

Conventional branching of the great vessels from the arch. No
proximal stenosis.

RIGHT vertebral dominant with LEFT vertebral patent. No ostial
disease.

RIGHT and LEFT carotid bifurcations free of ulceration, plaque
formation, or stenosis. No dissection or fibromuscular change.

There is no dissection or narrowing of the vertebral arteries in the
neck, RIGHT or LEFT. Mild uncinate spurring at C5-6 and C6-7 on the
RIGHT does not cause luminal stenosis or irregularity of the
vertebral artery.

Severe disc space narrowing at C5-6 and C6-7. Multilevel
ossification of the posterior longitudinal ligament with moderate
stenosis at the C5-6 and C6-7 levels. Lung apices clear. No neck
masses. No osseous findings.

Review of the MIP images confirms the above findings.
IMPRESSION: CTA examination of the head and neck shows no vascular stenosis or
occlusion. No acute intracranial findings.

Mild nodular enhancement of the pituitary stalk is redemonstrated,
nonspecific.

## 2015-10-15 IMAGING — CT CT ANGIO NECK
1 series · 1 of 2 positions shown · IV contrast (omnipaque)
Comparison: MRI brain 12/20/2013.

CLINICAL DATA: Sudden onset of vertigo 3 days ago. Neurologic exam
remarkable for LEFT ptosis and diplopia.

EXAM:
CT ANGIOGRAPHY HEAD AND NECK
TECHNIQUE: Multidetector CT imaging of the head and neck was performed using
the standard protocol during bolus administration of intravenous
contrast. Multiplanar CT image reconstructions and MIPs were
obtained to evaluate the vascular anatomy. Carotid stenosis
measurements (when applicable) are obtained utilizing NASCET
criteria, using the distal internal carotid diameter as the
denominator.
CONTRAST:  50mL OMNIPAQUE IOHEXOL 350 MG/ML SOLN

[Series 100: scout · sagittal · 0.6mm · 0.98mm/px · 1 of 2 slices shown]
[im 2/2]
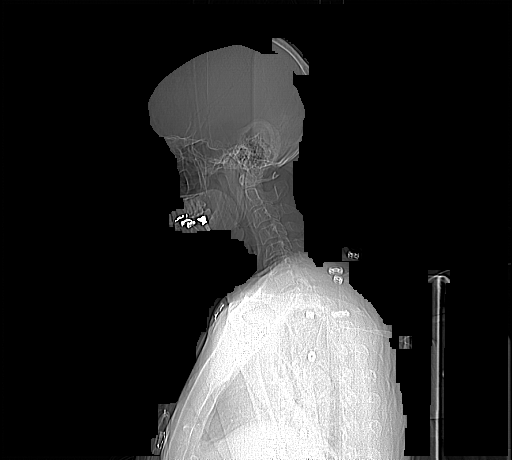

[1 of 2 positions shown; findings below may reference images not displayed]

FINDINGS: CTA HEAD FINDINGS

No evidence for acute infarction, hemorrhage, mass lesion,
hydrocephalus, or extra-axial fluid. Post infusion, no abnormal
enhancement of the brain or meninges. Calvarium intact.

Both internal carotid arteries are widely patent. The basilar artery
is widely patent with vertebrals both contributing, RIGHT dominant.
The anterior, middle, and posterior cerebral arteries appear widely
patent. No intracranial aneurysm. No cerebellar branch occlusion.

Compared with prior MR, Mild nodular enhancement of the pituitary
stalk, most notable at its hypothalamic insertion, is
redemonstrated. This appearance is nonspecific. Please see MR report
for discussion.

Review of the MIP images confirms the above findings.

CTA NECK FINDINGS

Conventional branching of the great vessels from the arch. No
proximal stenosis.

RIGHT vertebral dominant with LEFT vertebral patent. No ostial
disease.

RIGHT and LEFT carotid bifurcations free of ulceration, plaque
formation, or stenosis. No dissection or fibromuscular change.

There is no dissection or narrowing of the vertebral arteries in the
neck, RIGHT or LEFT. Mild uncinate spurring at C5-6 and C6-7 on the
RIGHT does not cause luminal stenosis or irregularity of the
vertebral artery.

Severe disc space narrowing at C5-6 and C6-7. Multilevel
ossification of the posterior longitudinal ligament with moderate
stenosis at the C5-6 and C6-7 levels. Lung apices clear. No neck
masses. No osseous findings.

Review of the MIP images confirms the above findings.
IMPRESSION: CTA examination of the head and neck shows no vascular stenosis or
occlusion. No acute intracranial findings.

Mild nodular enhancement of the pituitary stalk is redemonstrated,
nonspecific.

## 2018-01-29 ENCOUNTER — Encounter: Payer: Self-pay | Admitting: Internal Medicine

## 2018-01-29 ENCOUNTER — Ambulatory Visit (AMBULATORY_SURGERY_CENTER): Payer: Self-pay

## 2018-01-29 VITALS — Ht 67.0 in | Wt 179.0 lb

## 2018-01-29 DIAGNOSIS — Z1211 Encounter for screening for malignant neoplasm of colon: Secondary | ICD-10-CM

## 2018-01-29 MED ORDER — NA SULFATE-K SULFATE-MG SULF 17.5-3.13-1.6 GM/177ML PO SOLN: 1.0000 | Freq: Once | ORAL | 0 refills | Status: AC

## 2018-01-29 NOTE — Progress Notes (Signed)
Denies allergies to eggs or soy products. Denies complication of anesthesia or sedation. Denies use of weight loss medication. Denies use of O2.   Emmi instructions declined.   A 15.00 coupon for Suprep was given to the patient.

## 2018-02-09 ENCOUNTER — Encounter: Payer: Self-pay | Admitting: Internal Medicine

## 2018-02-09 ENCOUNTER — Ambulatory Visit (AMBULATORY_SURGERY_CENTER): Payer: 59 | Admitting: Internal Medicine

## 2018-02-09 VITALS — BP 88/61 | HR 83 | Temp 99.3°F | Resp 17 | Ht 68.0 in | Wt 182.0 lb

## 2018-02-09 DIAGNOSIS — Z1211 Encounter for screening for malignant neoplasm of colon: Secondary | ICD-10-CM

## 2018-02-09 DIAGNOSIS — K635 Polyp of colon: Secondary | ICD-10-CM | POA: Diagnosis not present

## 2018-02-09 DIAGNOSIS — D123 Benign neoplasm of transverse colon: Secondary | ICD-10-CM

## 2018-02-09 MED ORDER — SODIUM CHLORIDE 0.9 % IV SOLN: 500.0000 mL | Freq: Once | INTRAVENOUS | Status: DC

## 2018-02-09 NOTE — Op Note (Addendum)
Anthony Shaw Patient Name: Anthony Shaw Procedure Date: 02/09/2018 3:09 PM MRN: [id number] Endoscopist: Jerene Bears , MD Age: 57 Referring MD:  Date of Birth: 06/16/1960 Gender: Male Account #: [id number] Procedure:                Colonoscopy Indications:              Screening for colorectal malignant neoplasm, This                            is the patient's first colonoscopy Medicines:                Monitored Anesthesia Care Procedure:                Pre-Anesthesia Assessment:                           - Prior to the procedure, a History and Physical                            was performed, and patient medications and                            allergies were reviewed. The patient's tolerance of                            previous anesthesia was also reviewed. The risks                            and benefits of the procedure and the sedation                            options and risks were discussed with the patient.                            All questions were answered, and informed consent                            was obtained. Prior Anticoagulants: The patient has                            taken no previous anticoagulant or antiplatelet                            agents. ASA Grade Assessment: II - A patient with                            mild systemic disease. After reviewing the risks                            and benefits, the patient was deemed in                            satisfactory condition to undergo the procedure.  After obtaining informed consent, the colonoscope                            was passed under direct vision. Throughout the                            procedure, the patient's blood pressure, pulse, and                            oxygen saturations were monitored continuously. The                            Model CF-HQ190L [id number]) scope was introduced                            through the anus and  advanced to the terminal                            ileum. The colonoscopy was performed without                            difficulty. The patient tolerated the procedure                            well. The quality of the bowel preparation was                            excellent. The terminal ileum, ileocecal valve,                            appendiceal orifice, and rectum were photographed. Scope In: 3:22:38 PM Scope Out: 3:35:10 PM Scope Withdrawal Time: 0 hours 11 minutes 31 seconds  Total Procedure Duration: 0 hours 12 minutes 32 seconds  Findings:                 The digital rectal exam was normal.                           The terminal ileum appeared normal.                           A 5 mm polyp was found in the hepatic flexure. The                            polyp was sessile. The polyp was removed with a                            cold snare. Resection and retrieval were complete.                           Multiple small-mouthed diverticula were found in                            the sigmoid colon.  The retroflexed view of the distal rectum and anal                            verge was normal and showed no anal or rectal                            abnormalities. Complications:            No immediate complications. Estimated Blood Loss:     Estimated blood loss was minimal. Impression:               - The examined portion of the ileum was normal.                           - One 5 mm polyp at the hepatic flexure, removed                            with a cold snare. Resected and retrieved.                           - Diverticulosis in the sigmoid colon.                           - The distal rectum and anal verge are normal on                            retroflexion view. Recommendation:           - Patient has a contact number available for                            emergencies. The signs and symptoms of potential                             delayed complications were discussed with the                            patient. Return to normal activities tomorrow.                            Written discharge instructions were provided to the                            patient.                           - Resume previous diet.                           - Continue present medications.                           - Await pathology results.                           - Repeat colonoscopy is recommended for  surveillance. The colonoscopy date will be                            determined after pathology results from today's                            exam become available for review. Jerene Bears, MD 02/09/2018 3:37:18 PM This report has been signed electronically.

## 2018-02-09 NOTE — Progress Notes (Signed)
Called to room to assist during endoscopic procedure.  Patient ID and intended procedure confirmed with present staff. Received instructions for my participation in the procedure from the performing physician.  

## 2018-02-09 NOTE — Progress Notes (Signed)
Alert and oriented x3, pleased with MAC, report to RN  

## 2018-02-09 NOTE — Patient Instructions (Signed)
Handouts Provided:  Polyps  YOU HAD AN ENDOSCOPIC PROCEDURE TODAY AT THE [facility]:   Refer to the procedure report that was given to you for any specific questions about what was found during the examination.  If the procedure report does not answer your questions, please call your gastroenterologist to clarify.  If you requested that your care partner not be given the details of your procedure findings, then the procedure report has been included in a sealed envelope for you to review at your convenience later.  YOU SHOULD EXPECT: Some feelings of bloating in the abdomen. Passage of more gas than usual.  Walking can help get rid of the air that was put into your GI tract during the procedure and reduce the bloating. If you had a lower endoscopy (such as a colonoscopy or flexible sigmoidoscopy) you may notice spotting of blood in your stool or on the toilet paper. If you underwent a bowel prep for your procedure, you may not have a normal bowel movement for a few days.  Please Note:  You might notice some irritation and congestion in your nose or some drainage.  This is from the oxygen used during your procedure.  There is no need for concern and it should clear up in a day or so.  SYMPTOMS TO REPORT IMMEDIATELY:   Following lower endoscopy (colonoscopy or flexible sigmoidoscopy):  Excessive amounts of blood in the stool  Significant tenderness or worsening of abdominal pains  Swelling of the abdomen that is new, acute  Fever of 100F or higher  For urgent or emergent issues, a gastroenterologist can be reached at any hour by calling [phone].   DIET:  We do recommend a small meal at first, but then you may proceed to your regular diet.  Drink plenty of fluids but you should avoid alcoholic beverages for 24 hours.  ACTIVITY:  You should plan to take it easy for the rest of today and you should NOT DRIVE or use heavy machinery until tomorrow (because of the sedation  medicines used during the test).    FOLLOW UP: Our staff will call the number listed on your records the next business day following your procedure to check on you and address any questions or concerns that you may have regarding the information given to you following your procedure. If we do not reach you, we will leave a message.  However, if you are feeling well and you are not experiencing any problems, there is no need to return our call.  We will assume that you have returned to your regular daily activities without incident.  If any biopsies were taken you will be contacted by phone or by letter within the next 1-3 weeks.  Please call us at [phone] if you have not heard about the biopsies in 3 weeks.    SIGNATURES/CONFIDENTIALITY: You and/or your care partner have signed paperwork which will be entered into your electronic medical record.  These signatures attest to the fact that that the information above on your After Visit Summary has been reviewed and is understood.  Full responsibility of the confidentiality of this discharge information lies with you and/or your care-partner.  

## 2018-02-09 NOTE — Progress Notes (Signed)
Pt's states no medical or surgical changes since previsit or office visit. 

## 2018-02-10 ENCOUNTER — Telehealth: Payer: Self-pay

## 2018-02-10 ENCOUNTER — Telehealth: Payer: Self-pay | Admitting: *Deleted

## 2018-02-10 NOTE — Telephone Encounter (Signed)
No answer for post procedure call back. Left message for patients to call with questions or concerns. Sm

## 2018-02-10 NOTE — Telephone Encounter (Signed)
NO ANSWER, MESSAGE LEFT FOR PATIENT. 

## 2018-02-16 ENCOUNTER — Encounter: Payer: Self-pay | Admitting: Internal Medicine

## 2023-01-20 ENCOUNTER — Encounter: Payer: Self-pay | Admitting: Internal Medicine
